# Patient Record
Sex: Male | Born: 1950 | Race: White | Hispanic: No | Marital: Single | Smoking: Never smoker
Health system: Southern US, Community
[De-identification: ages and names within clinical notes are randomized; demographics above are authoritative.]

## PROBLEM LIST (undated history)

## (undated) DIAGNOSIS — K5090 Crohn's disease, unspecified, without complications: Secondary | ICD-10-CM

## (undated) DIAGNOSIS — G4730 Sleep apnea, unspecified: Secondary | ICD-10-CM

## (undated) DIAGNOSIS — N529 Male erectile dysfunction, unspecified: Secondary | ICD-10-CM

## (undated) DIAGNOSIS — K625 Hemorrhage of anus and rectum: Secondary | ICD-10-CM

## (undated) DIAGNOSIS — E669 Obesity, unspecified: Secondary | ICD-10-CM

## (undated) DIAGNOSIS — L719 Rosacea, unspecified: Secondary | ICD-10-CM

## (undated) DIAGNOSIS — L649 Androgenic alopecia, unspecified: Secondary | ICD-10-CM

## (undated) DIAGNOSIS — L309 Dermatitis, unspecified: Secondary | ICD-10-CM

## (undated) DIAGNOSIS — G4700 Insomnia, unspecified: Secondary | ICD-10-CM

## (undated) DIAGNOSIS — K219 Gastro-esophageal reflux disease without esophagitis: Secondary | ICD-10-CM

## (undated) HISTORY — PX: HERNIA REPAIR: SHX51

## (undated) HISTORY — DX: Obesity, unspecified: E66.9

## (undated) HISTORY — PX: COSMETIC SURGERY: SHX468

## (undated) HISTORY — DX: Sleep apnea, unspecified: G47.30

## (undated) HISTORY — DX: Rosacea, unspecified: L71.9

## (undated) HISTORY — DX: Dermatitis, unspecified: L30.9

## (undated) HISTORY — DX: Gastro-esophageal reflux disease without esophagitis: K21.9

## (undated) HISTORY — DX: Male erectile dysfunction, unspecified: N52.9

## (undated) HISTORY — DX: Androgenic alopecia, unspecified: L64.9

## (undated) HISTORY — DX: Hemorrhage of anus and rectum: K62.5

## (undated) HISTORY — DX: Insomnia, unspecified: G47.00

---

## 2000-08-29 ENCOUNTER — Ambulatory Visit (HOSPITAL_COMMUNITY): Admission: RE | Admit: 2000-08-29 | Discharge: 2000-08-29 | Payer: Self-pay | Admitting: Gastroenterology

## 2000-08-29 ENCOUNTER — Encounter (INDEPENDENT_AMBULATORY_CARE_PROVIDER_SITE_OTHER): Payer: Self-pay | Admitting: *Deleted

## 2001-12-15 ENCOUNTER — Encounter: Admission: RE | Admit: 2001-12-15 | Discharge: 2001-12-15 | Payer: Self-pay | Admitting: Emergency Medicine

## 2001-12-15 ENCOUNTER — Encounter: Payer: Self-pay | Admitting: Emergency Medicine

## 2009-12-11 DIAGNOSIS — K625 Hemorrhage of anus and rectum: Secondary | ICD-10-CM

## 2009-12-11 HISTORY — DX: Hemorrhage of anus and rectum: K62.5

## 2010-05-31 ENCOUNTER — Ambulatory Visit
Admission: RE | Admit: 2010-05-31 | Discharge: 2010-05-31 | Payer: Self-pay | Source: Home / Self Care | Attending: Orthopedic Surgery | Admitting: Orthopedic Surgery

## 2010-06-04 LAB — DIFFERENTIAL
Basophils Absolute: 0 10*3/uL (ref 0.0–0.1)
Basophils Relative: 1 % (ref 0–1)
Eosinophils Absolute: 0.2 10*3/uL (ref 0.0–0.7)
Eosinophils Relative: 3 % (ref 0–5)
Lymphocytes Relative: 22 % (ref 12–46)
Lymphs Abs: 1.9 10*3/uL (ref 0.7–4.0)
Monocytes Absolute: 0.8 10*3/uL (ref 0.1–1.0)
Monocytes Relative: 9 % (ref 3–12)
Neutro Abs: 5.8 10*3/uL (ref 1.7–7.7)

## 2010-06-04 LAB — CBC
HCT: 41.4 % (ref 39.0–52.0)
Hemoglobin: 14.5 g/dL (ref 13.0–17.0)
MCH: 29.2 pg (ref 26.0–34.0)
MCHC: 35 g/dL (ref 30.0–36.0)
MCV: 83.3 fL (ref 78.0–100.0)
RBC: 4.97 MIL/uL (ref 4.22–5.81)
RDW: 12.6 % (ref 11.5–15.5)
WBC: 8.8 10*3/uL (ref 4.0–10.5)

## 2010-06-04 LAB — BASIC METABOLIC PANEL
BUN: 15 mg/dL (ref 6–23)
CO2: 28 mEq/L (ref 19–32)
Calcium: 9 mg/dL (ref 8.4–10.5)
Chloride: 106 mEq/L (ref 96–112)
Creatinine, Ser: 1.23 mg/dL (ref 0.4–1.5)
GFR calc non Af Amer: >60 mL/min (ref >60)
Glucose, Bld: 79 mg/dL (ref 70–99)
Potassium: 4.5 mEq/L (ref 3.5–5.1)
Sodium: 139 mEq/L (ref 135–145)

## 2010-06-05 LAB — WOUND CULTURE

## 2010-06-06 LAB — ANAEROBIC CULTURE: Gram Stain: NONE SEEN

## 2010-06-11 NOTE — H&P (Signed)
NAMESARATH, PRIVOTT                  ACCOUNT NO.:  [id number]  MEDICAL RECORD NO.:  [id number]          PATIENT TYPE:  AMB  LOCATION:  DSC                          FACILITY:  [facility]  PHYSICIAN:  Betha Loa, MD        DATE OF BIRTH:  [birth year]  DATE OF ADMISSION:  05/31/2010 DATE OF DISCHARGE:                             HISTORY & PHYSICAL   CHIEF COMPLAINT:  Left long finger infection.  HISTORY:  Mr. Scholz is a 60 year old right-hand-dominant white male.  He states that over the past 4 months, he has noticed a divot in the nail of his left long finger.  This would occasionally break open and drain clear fluid.  On Monday or Tuesday, he noted some drainage from underneath the cuticle.  It then began to become swollen and red.  He saw his primary care physician on Tuesday who started him on Keflex.  He followed up at an urgent care facility last night and was given an intramuscular injection of an antibiotic and a prescription for doxycycline p.o., but has not started that.  He states he feels a little better since last night.  He has had no fevers, chills, night sweats. He does not have a history of gout.  He says he does not have much pain now.  He notes no pain in the volar aspect of his finger.  He has not had symptoms like this before.  ALLERGIES:  No known drug allergies.  PAST MEDICAL HISTORY: 1. Crohn disease. 2. GERD.  PAST SURGICAL HISTORY:  Fistula removal and bowel resection secondary to Crohn.  MEDICATIONS:  Finasteride, omeprazole, and Imodium.  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 He does not smoke and drinks alcohol occasionally.  FAMILY HISTORY:  Negative.  REVIEW OF SYSTEMS:  Thirteen-point review of systems is negative.  PHYSICAL EXAMINATION:  GENERAL:  Alert and oriented x3, well developed, well nourished. HEENT:  Head; normocephalic, atraumatic. NECK:  Supple, full range of motion. CHEST:  Regular rate and  rhythm. LUNGS:  Clear to auscultation bilaterally. ABDOMEN:  Nontender, nondistended. EXTREMITIES:  Bilateral upper extremities are distally neurovascularly intact in radial, median, and ulnar nerve distributions.  He has intact sensation and capillary refill in all fingertips.  He can flex and extend the IP joints of the thumbs and can cross his fingers.  Right upper extremity is without wounds, without tenderness to palpation. Left upper extremity with the exception of the long finger is without wounds, without tenderness to palpation.  In the long finger, the nail has a divot centrally.  There is erythema at the dorsum of the DIP joint.  There is a little bit of erythema on the ulnar side of the middle phalanx.  He is not tender in the proximal or middle phalanges or the PIP joint.  He is not tender in the pad of the finger.  He is tender directly dorsally over the DIP joint and a little bit volarly as well. There is no active drainage from underneath the cuticle at this time.  ASSESSMENT AND PLAN:  I  discussed with Mr. Denis this looks like an infected mucoid cyst.  We discussed nature of mucoid cysts and distal interphalangeal joint infections.  It is possible that this is a gouty attack, although he has no history of gout.  He seems to be feeling better after antibiotics.  I think it is best to treat this as an infection of the distal interphalangeal joint.  We will draw CBC, BMP, and uric acid.  We will have him arranged for surgical drainage of the DIP joint of the left long finger.  At that time, I will try to excise the cyst as well.  If there are any significant osteophytes, I will try to excise those as well.  Risks, benefits, and alternatives of surgery were discussed including the risks of blood loss, infection; damage to nerves, vessels, tendons, ligaments, bone; failure of procedure, need for additional procedure; complications with wound healing, continued pain,  continued infection.  He voices understanding of these risks and elected to proceed.     Betha Loa, MD     KK/MEDQ  D:  05/31/2010  T:  06/01/2010  Job:  [id number]  Electronically Signed by Betha Loa  on 06/11/2010 01:53:30 PM

## 2010-06-11 NOTE — Op Note (Signed)
Joshua Miles, Joshua Miles                  ACCOUNT NO.:  [id number]  MEDICAL RECORD NO.:  [id number]          PATIENT TYPE:  AMB  LOCATION:  DSC                          FACILITY:  [facility]  PHYSICIAN:  Betha Loa, MD        DATE OF BIRTH:  April 28, 1951  DATE OF PROCEDURE:  05/31/2010 DATE OF DISCHARGE:                              OPERATIVE REPORT   PREOPERATIVE DIAGNOSIS:  Left long finger distal interphalangeal joint infection and mucoid cyst.  POSTOPERATIVE DIAGNOSIS:  Left long finger distal interphalangeal joint infection and mucoid cyst.  PROCEDURE:  Irrigation and debridement of left long finger distal interphalangeal joint and excision of mucoid cyst.  SURGEON:  Betha Loa, MD  ASSISTANT:  None.  ANESTHESIA:  Digital block with half-and-half solution 1% plain lidocaine and 0.25% plain Marcaine.  FINDINGS:  No gross purulence.  SPECIMENS:  Cultures of DIP joint to Micro.  TOURNIQUET TIME:  Penrose drain, 44 minutes.  DISPOSITION:  Stable.  INDICATIONS:  Mr. Smick is a 60 year old right-hand-dominant white male who has noted a divot in his nail for the past 3-4 months.  He said it was occasionally breaking and clear fluid would drain from underneath the nail.  Two days ago, it began to drain from underneath the cuticle and then began to get red and swollen and painful.  He saw his primary care physician who started him on Keflex.  He saw in urgent care facility last night who gave him an intramuscular shot of antibiotic.  He states he feels a little bit better today, though he still notes the swelling and erythema.  He called my office for further care.  [facility]  He was noted to have an erythematous and swollen DIP joint.  It was tender over the dorsum of the DIP joint.  He had some tenderness volarly as well.  There was no tenderness in the proximal or middle phalanges.  There was a groove in the nail, but no active drainage.  I  discussed with Mr. Wymore the nature of infected DIP joints.  We discussed the risks, benefits, and alternatives of surgical irrigation, debridement, and excision of the cyst.  Risks of blood loss, infection, damage to nerves, vessels, tendons, ligaments, and bone, failure of surgery, need for additional surgery, complications with wound healing, continued pain, and continued infection.  He voiced understanding these risks and elected to proceed.  OPERATIVE COURSE:  Mr. Wamble was identified preoperatively by myself.  He and I agreed upon procedure and site of procedure.  The surgical site was marked.  The risks, benefits, and alternatives of surgery were reviewed and he wished to proceed.  Surgical consent had been signed. He was taken to the operating room and placed on the operating table in supine position with left upper extremity on an arm board.  C-arm radiographs were taken in AP and lateral planes of the DIP joint.  There was no significant osteophyte formation.  The surgical pause was performed between surgeons, Anesthesia, and operating room staff and all were in agreement as to the patient, procedure,  and site of procedure. The left upper extremity was prepped and draped in a normal sterile orthopedic fashion using Betadine scrub and paint.  The Penrose drain was used at the proximal aspect of the long finger and was up for 44 minutes total.  A digital block had been performed after the surgical pause with a half-and-half solution of 1% plain lidocaine and 0.25% plain Marcaine.  This was adequate to give total digital anesthesia.  An incision was made on both the radial and ulnar side of the DIP joint dorsal to midline.  This was carried into the subcutaneous tissues.  There was significant inflammation.  No gross purulence was encountered.  The extensor tendon was identified and protected throughout the case.  The DIP joint was entered.  The dorsal capsule was excised.  There was  no gross purulence within here either.  Cultures of the DIP joint were sent for aerobes and anaerobes.  The DIP joint was debrided.  There was an osteophyte on the dorsoradial side of the distal phalanx and it was debrided.  This was done after irrigation of the wounds.  The wounds were copiously irrigated with 1000 mL of sterile saline.  An angiocatheter was used to irrigate the DIP joint thoroughly.  The area of the cyst at the dorsum of the distal phalanx was entered subcutaneously.  A rongeur was used to debride the cyst.  After thorough irrigation of all areas, the wound was packed open with 0.25-inch iodoform gauze.  This was placed both underneath the tendon over the DIP joint and in the area of the cyst.  The wounds were dressed with sterile Xeroform and 4 x 4's and wrapped with a Kling bandage.  An Alumafoam splint was placed and wrapped with Coban lightly.  The tourniquet was removed at 44 minutes.  The fingertip was pink with brisk capillary refill after removal of the tourniquet.  The operative drapes were broken down and the patient was raised from the bed safely.  He will be given Percocet 5/325 one to two p.o. q.6 h. p.r.n. pain, dispensed #50 and Bactrim DS 1 p.o. b.i.d. x7 days.  I will see him back in the office on Monday.  On Sunday, he will take his dressing off and pull his packing and do a warm salt water soak.  He will then replace his Alumafoam splint and rewrap his finger lightly.  I will see him on Monday.  If he has any problems, he is instructed to call the office. Procedure was tolerated well.     Betha Loa, MD     KK/MEDQ  D:  05/31/2010  T:  06/01/2010  Job:  [id number]  Electronically Signed by Betha Loa  on 06/11/2010 01:55:53 PM

## 2010-09-28 NOTE — Procedures (Signed)
[place]  Patient:    Joshua Miles, Joshua Miles                           MRN: [id number] Proc. Date: 08/29/00 Attending:  Florencia Reasons, M.D. CC:         Oley Balm. Georgina Pillion, M.D.   Procedure Report  PROCEDURE:  Colonoscopy with biopsies.  INDICATION:  This 60 year old gentleman had a terminal ileectomy around 83 for Crohns ileitis.  He is asymptomatic at this time apart from a tendency toward loose bowel movements.  He has not been maintained on therapy for his Crohns disease for many years and has had a very benign clinical course over the past two decades.  FINDINGS:  Unusual appearance, wherein the native ileocecal valve and terminal ileum appeared intact and normal, but there was also evidence of an anastomosis between the floor of the cecum and the small bowel.  Minimal exudate present at the anastomosis.  DESCRIPTION OF PROCEDURE:  The nature, purpose, and risks of the procedure had been discussed with the patient, who provided written consent.  Sedation for this procedure and the upper endoscopy which preceded it totalled fentanyl 100 mcg and Versed 10 mg IV without arrhythmias or desaturation.  The Olympus adjustable-tension pediatric video colonoscope was advanced around the colon easily.  The cecum had a surgical anastomosis from the floor of the cecum to small bowel, but the diameter of the anastomosis was not quite large enough to allow the scope to enter.  The diameter of the scope was estimated to be approximately 10 or 11 mm.  The rim of the anastomosis had minimal exudate present.  In addition, the ileocecal valve had a normal appearance, and the scope was able to be slid through the orifice of the ileocecal valve quite easily into the terminal ileum, without any evidence of stenosis.  The terminal ileum had a very normal endoscopic appearance without any inflammatory changes, exudate, erythema, ulceration, etc.  One or two  biopsies were obtained of it as well.  Pullback was then performed around the colon, which was normal.  The quality of prep was excellent, and it was felt that all areas were well-seen.  I did not see any colitis, cancer, polyps, vascular malformations, or diverticulosis, and retroflexion in the rectum was unremarkable.  The patient tolerated this procedure well, and there were no apparent complications.  IMPRESSION:  Crohns ileitis with minimal, if any, activity at the present time, with anatomic abnormality of the cecum, status post surgical anastomosis, with intact native anatomy still present, as described above.  PLAN:  Await pathology.  No endoscopic indication for medical therapy identified. DD:  08/29/00 TD:  09/01/00 Job: [id number]

## 2010-09-28 NOTE — Procedures (Signed)
[place]  Patient:    Joshua Miles, Joshua Miles                           MRN: [id number] Proc. Date: 08/29/00 Attending:  Florencia Reasons, M.D. CC:         Oley Balm. Georgina Pillion, M.D.   Procedure Report  PROCEDURE:  Upper endoscopy with biopsies.  INDICATION:  Intermittent dysphagia symptoms.  FINDINGS:  Distal erosive esophagitis with a fairly tight esophageal ring.  A 5 cm hiatal hernia.  DESCRIPTION OF PROCEDURE:  The nature, purpose, and risks of the procedure had been discussed with the patient, who provided written consent.  Sedation was fentanyl 100 mcg and Versed 10 mg IV without arrhythmias or desaturation.  The Olympus video endoscope was passed under direct vision.  The vocal cords looked normal, and the esophagus was readily entered.  The proximal esophagus was normal, but the distal several centimeters had linear erythema and a tongue-like erosion above the well-defined and fairly tight esophageal ring at 40 cm, below which was a 5 cm hiatal hernia.  The stomach contained no significant residual and had entirely normal mucosa, including retroflex view of the proximal stomach, which showed that the diaphragmatic hiatus was actually fairly tight rather than patulous in character.  The pylorus, duodenal bulb, and second duodenum looked normal. Biopsies were obtained from the eroded area of the distal esophagus prior to removal of the scope.  The patient tolerated the procedure well, and there were no apparent complications.  IMPRESSION: 1. Distal erosive esophagitis. 2. Esophageal ring. 3. A 5 cm hiatal hernia.  PLAN: 1. Initiate PPI therapy with Protonix 40 mg daily. 2. Await pathology on todays biopsies. 3. Proceed to colonoscopic evaluation in view of history of Crohns disease. DD:  08/29/00 TD:  09/01/00 Job: [id number]

## 2012-11-22 ENCOUNTER — Encounter (HOSPITAL_COMMUNITY): Payer: Self-pay | Admitting: Family Medicine

## 2012-11-22 ENCOUNTER — Emergency Department (HOSPITAL_COMMUNITY)
Admission: EM | Admit: 2012-11-22 | Discharge: 2012-11-22 | Disposition: A | Discharge status: Home / Self Care | Payer: BC Managed Care – PPO | Attending: Emergency Medicine | Admitting: Emergency Medicine

## 2012-11-22 DIAGNOSIS — Z0489 Encounter for examination and observation for other specified reasons: Secondary | ICD-10-CM | POA: Insufficient documentation

## 2012-11-22 DIAGNOSIS — Z79899 Other long term (current) drug therapy: Secondary | ICD-10-CM | POA: Insufficient documentation

## 2012-11-22 DIAGNOSIS — K5090 Crohn's disease, unspecified, without complications: Secondary | ICD-10-CM | POA: Insufficient documentation

## 2012-11-22 HISTORY — DX: Crohn's disease, unspecified, without complications: K50.90

## 2012-11-22 NOTE — ED Notes (Signed)
Patient states that he was pulled over by the police and charged with DUI. States he blew breathalyzer and requested to have blood test drawn but request was declined by [employer]. Patient presents requesting to have a blood alcohol drawn.

## 2012-11-22 NOTE — ED Notes (Signed)
Patient states that he is here to have a blood alcohol test drawn. Verbal consent given by patient to draw his blood at his request. Procedure explained to patient and patient verbalized understanding. Tourniquet was applied to left arm. Left antecubital area cleaned with betadine. Betadine wiped from area with sterile saline and area was dried to with sterile 2 x 2 gauze. One vial of blood obtained with a 23G x 3/4" Push Button Blood Collection Set without difficulty. Tourniquet was removed and a gauze dressing was applied to venipuncture site. Vial of blood was labeled in front of patient after 2 patient identifiers (Name and date of birth) were verbalized by patient. Vial of blood sent to lab for analysis.

## 2012-11-26 NOTE — ED Provider Notes (Signed)
   History    CSN: [id number] Arrival date & time 11/22/12  0155  First MD Initiated Contact with Patient 11/22/12 0256     Chief Complaint  Patient presents with  . Drug / Alcohol Assessment  . Labs Only   (Consider location/radiation/quality/duration/timing/severity/associated sxs/prior Treatment) HPI Pt is here for blood alcohol testing. Pulled over by [employer] earlier in the evening on suspicion for driving under the influence of alcohol. Pt states he just had a few beers. No current complaints.  Past Medical History  Diagnosis Date  . Crohn's disease    Past Surgical History  Procedure Laterality Date  . Hernia repair    . Cosmetic surgery     No family history on file. History  Substance Use Topics  . Smoking status: Never Smoker   . Smokeless tobacco: Not on file  . Alcohol Use: Yes     Comment: 2 beers/week    Review of Systems  Gastrointestinal: Negative for nausea and vomiting.  Neurological: Negative for dizziness, weakness, numbness and headaches.  Psychiatric/Behavioral: Negative for confusion.  All other systems reviewed and are negative.    Allergies  Review of patient's allergies indicates no known allergies.  Home Medications   Current Outpatient Rx  Name  Route  Sig  Dispense  Refill  . acetaminophen (TYLENOL) 500 MG tablet   Oral   Take 500 mg by mouth every 6 (six) hours as needed for pain.         . eszopiclone (LUNESTA) 2 MG TABS   Oral   Take 2 mg by mouth daily as needed (sleep). Take immediately before bedtime         . febuxostat (ULORIC) 40 MG tablet   Oral   Take 40 mg by mouth every morning.         . loperamide (IMODIUM) 2 MG capsule   Oral   Take 2 mg by mouth 4 (four) times daily as needed for diarrhea or loose stools.          BP 129/78  Pulse 90  Temp(Src) 98 F (36.7 C) (Oral)  Resp 18  SpO2 97% Physical Exam  Nursing note and vitals reviewed. Constitutional: He is oriented to person, place, and time. He  appears well-developed and well-nourished. No distress.  HENT:  Head: Normocephalic and atraumatic.  Mouth/Throat: Oropharynx is clear and moist.  Eyes: EOM are normal. Pupils are equal, round, and reactive to light.  Neck: Normal range of motion. Neck supple.  Cardiovascular: Normal rate and regular rhythm.   Pulmonary/Chest: Effort normal.  Abdominal: Soft. Bowel sounds are normal.  Musculoskeletal: Normal range of motion. He exhibits no edema and no tenderness.  Neurological: He is alert and oriented to person, place, and time.  Moves all ext without deficit, sensation intact, clear speech, ambulatory without assistance.   Skin: Skin is warm and dry. No rash noted. No erythema.  Psychiatric: He has a normal mood and affect. His behavior is normal.    ED Course  Procedures (including critical care time) Labs Reviewed  ETHANOL - Abnormal; Notable for the following:    Alcohol, Ethyl (B) 145 (*)    All other components within normal limits   No results found. 1. Encounter for blood-alcohol test     MDM    Loren Racer, MD 11/26/12 [phone]

## 2013-08-25 ENCOUNTER — Other Ambulatory Visit: Payer: Self-pay | Admitting: Family Medicine

## 2013-08-25 ENCOUNTER — Ambulatory Visit
Admission: RE | Admit: 2013-08-25 | Discharge: 2013-08-25 | Disposition: A | Discharge status: Home / Self Care | Payer: BC Managed Care – PPO | Source: Ambulatory Visit | Attending: Family Medicine | Admitting: Family Medicine

## 2013-08-25 DIAGNOSIS — R079 Chest pain, unspecified: Secondary | ICD-10-CM

## 2013-09-10 ENCOUNTER — Encounter: Payer: Self-pay | Admitting: General Surgery

## 2013-09-10 DIAGNOSIS — R079 Chest pain, unspecified: Secondary | ICD-10-CM

## 2013-09-22 ENCOUNTER — Ambulatory Visit: Payer: BC Managed Care – PPO | Admitting: Cardiology

## 2013-09-28 ENCOUNTER — Encounter: Payer: Self-pay | Admitting: Cardiology

## 2013-09-28 ENCOUNTER — Ambulatory Visit (INDEPENDENT_AMBULATORY_CARE_PROVIDER_SITE_OTHER): Payer: BC Managed Care – PPO | Admitting: Cardiology

## 2013-09-28 VITALS — BP 127/78 | HR 74 | Ht 72.0 in | Wt 235.0 lb

## 2013-09-28 DIAGNOSIS — R079 Chest pain, unspecified: Secondary | ICD-10-CM

## 2013-09-28 DIAGNOSIS — G4730 Sleep apnea, unspecified: Secondary | ICD-10-CM | POA: Insufficient documentation

## 2013-09-28 NOTE — Progress Notes (Signed)
[address] [place], [place]  [address] Phone: [phone] Fax:  [phone]  Date:  09/28/2013   ID:  Joshua CrockerRandy R Seder, DOB 1951/03/29, MRN [id number]  PCP:  Darrow BussingKOIRALA,DIBAS, MD  Cardiologist:  Armanda Magicraci Dashonda Bonneau, MD     History of Present Illness: Joshua Miles is a 63 y.o. male with a history of GERD who presents today for evaluation of chest pain.  He recently saw his PCP complaining that for several days he has been having left sided chest pain that is sharp and does not radiate and only lasts a few minutes.  He also has some chest discomfort that he describes as a chest pressure that only occurs at rest and usually occurs mainly at night.  This all seemed to start about 2 months ago at the time when he had a viral infection with acute labarynthitis.   He EKG done in PCP office on 07/09/2013 showed NSR with with no ST change.  He denies any SOB, DOE, palpitations, LE edema or syncope.  He is very active and walks for exercise and works out in his yard and never gets any chest discomfort.  He also has OSA but has not tolerated CPAP in the past but would like to try again.   Wt Readings from Last 3 Encounters:  09/28/13 235 lb (106.595 kg)     Past Medical History  Diagnosis Date  . Crohn's disease   . ED (erectile dysfunction)     secondary to fracture penis-Repair by Dr Logan BoresEvans  . Rosacea   . Esophageal reflux     well controlled on prilosec  . Insomnia   . Sleep apnea     Dr Pollyann Kennedyosen CPAP  . Eczema   . Male pattern alopecia   . Rectal bleeding 08','11    Hemorrhoids??    Current Outpatient Prescriptions  Medication Sig Dispense Refill  . acetaminophen (TYLENOL) 500 MG tablet Take 500 mg by mouth every 6 (six) hours as needed for pain.      . clotrimazole-betamethasone (LOTRISONE) cream Apply 1 application topically as needed.      . eszopiclone (LUNESTA) 2 MG TABS Take 2 mg by mouth daily as needed (sleep). Take immediately before bedtime      . febuxostat (ULORIC) 40 MG  tablet Take 80 mg by mouth every morning.       . finasteride (PROSCAR) 5 MG tablet Take 5 mg by mouth daily. Pt takes 1/4 tablet daily      . loperamide (IMODIUM) 2 MG capsule Take 2 mg by mouth once.       Marland Kitchen. omeprazole (PRILOSEC) 20 MG capsule Take 20 mg by mouth daily.      . sildenafil (VIAGRA) 100 MG tablet Take 100 mg by mouth daily as needed for erectile dysfunction.      . tacrolimus (PROTOPIC) 0.1 % ointment Apply topically 2 (two) times daily.       No current facility-administered medications for this visit.    Allergies:   No Known Allergies  Social History:  The patient  reports that he has never smoked. He does not have any smokeless tobacco history on file. He reports that he drinks alcohol. He reports that he does not use illicit drugs.   Family History:  The patient's family history includes CAD in his father; Heart disease in his father.   ROS:  Please see the history of present illness.      All other systems reviewed and  negative.   PHYSICAL EXAM: VS:  BP 127/78  Pulse 74  Ht 6' (1.829 m)  Wt 235 lb (106.595 kg)  BMI 31.86 kg/m2 Well nourished, well developed, in no acute distress HEENT: normal Neck: no JVD Cardiac:  normal S1, S2; RRR; no murmur Lungs:  clear to auscultation bilaterally, no wheezing, rhonchi or rales Abd: soft, nontender, no hepatomegaly Ext: no edema Skin: warm and dry Neuro:  CNs 2-12 intact, no focal abnormalities noted       ASSESSMENT AND PLAN:  1. Chest pain with atypical components.  EKG is nonischemic.  His CRF include age 76>40, male sex and family history of CAD. - Stress myoview 2.  OSA currently not using CPAP.  He has been intolerant of any of the masks in the past.  He is a mouth breather and cannot use anything other than a full face mask which he has not tolerated. He feels like he is not getting enough air with the CPAP - I will get a split night PSG   Followup with me PRN  Signed, Armanda Magicraci Carma Dwiggins, MD 09/28/2013 11:36 AM

## 2013-09-28 NOTE — Patient Instructions (Signed)
Your physician recommends that you continue on your current medications as directed. Please refer to the Current Medication list given to you today.  Your physician has requested that you have an exercise stress myoview. For further information please visit [email or web address]. Please follow instruction sheet, as given.  Your physician has recommended that you have a sleep study. This test records several body functions during sleep, including: brain activity, eye movement, oxygen and carbon dioxide blood levels, heart rate and rhythm, breathing rate and rhythm, the flow of air through your mouth and nose, snoring, body muscle movements, and chest and belly movement. (Schedule at [facility])  Your physician recommends that you schedule a follow-up appointment As Needed

## 2013-10-12 ENCOUNTER — Ambulatory Visit (HOSPITAL_COMMUNITY): Payer: BC Managed Care – PPO | Attending: Cardiology | Admitting: Radiology

## 2013-10-12 VITALS — BP 112/84 | Ht 72.0 in | Wt 229.0 lb

## 2013-10-12 DIAGNOSIS — R079 Chest pain, unspecified: Secondary | ICD-10-CM

## 2013-10-12 DIAGNOSIS — R42 Dizziness and giddiness: Secondary | ICD-10-CM | POA: Insufficient documentation

## 2013-10-12 DIAGNOSIS — R0989 Other specified symptoms and signs involving the circulatory and respiratory systems: Secondary | ICD-10-CM

## 2013-10-12 DIAGNOSIS — R0609 Other forms of dyspnea: Secondary | ICD-10-CM

## 2013-10-12 MED ORDER — TECHNETIUM TC 99M SESTAMIBI GENERIC - CARDIOLITE
11.0000 | Freq: Once | INTRAVENOUS | Status: AC | PRN
  Administered 2013-10-12: 11 via INTRAVENOUS

## 2013-10-12 MED ORDER — TECHNETIUM TC 99M SESTAMIBI GENERIC - CARDIOLITE
33.0000 | Freq: Once | INTRAVENOUS | Status: AC | PRN
Start: 2013-10-12 — End: 2013-10-12
  Administered 2013-10-12: 33 via INTRAVENOUS

## 2013-10-12 NOTE — Progress Notes (Signed)
[facility] SITE 3 NUCLEAR MED [address] [place], [place] [address] [phone]    Cardiology Nuclear Med Study  Joshua Miles is a 63 y.o. male     MRN : [id number]     DOB: 11-May-1951  Procedure Date: 10/12/2013  Nuclear Med Background Indication for Stress Test:  Evaluation for Ischemia History: No Known History of CAD Cardiac Risk Factors: Family History - CAD  Symptoms:  Chest Pain;Dizziness   Nuclear Pre-Procedure Caffeine/Decaff Intake:  None> 12 hrs NPO After: 7:30am   Lungs:  clear O2 Sat: 97% on room air. IV 0.9% NS with Angio Cath:  22g  IV Site: R Antecubital x 1, tolerated well IV Started by:  Irean Hong, RN  Chest Size (in):  46 Cup Size: n/a  Height: 6' (1.829 m)  Weight:  229 lb (103.874 kg)  BMI:  Body mass index is 31.05 kg/(m^2). Tech Comments:  N/A    Nuclear Med Study 1 or 2 day study: 1 day  Stress Test Type:  Stress  Reading MD: N/A  Order Authorizing Provider:  Armanda Magic, MD  Resting Radionuclide: Technetium 61m Sestamibi  Resting Radionuclide Dose: 11.0 mCi   Stress Radionuclide:  Technetium 38m Sestamibi  Stress Radionuclide Dose: 33.0 mCi           Stress Protocol Rest HR: 50 Stress HR: 151  Rest BP: 112/84 Stress BP: 150/70  Exercise Time (min): 8:04 METS: 10.10   Predicted Max HR: 158 bpm % Max HR: 95.57 bpm Rate Pressure Product: 14970   Dose of Adenosine (mg):  n/a Dose of Lexiscan: n/a mg  Dose of Atropine (mg): n/a Dose of Dobutamine: n/a mcg/kg/min (at max HR)  Stress Test Technologist: Frederick Peers, EMT-P  Nuclear Technologist:  Doyne Keel, CNMT     Rest Procedure:  Myocardial perfusion imaging was performed at rest 45 minutes following the intravenous administration of Technetium 39m Sestamibi. Rest ECG: NSR - Normal EKG  Stress Procedure:  The patient exercised on the treadmill utilizing the Bruce Protocol for 8:04 minutes. The patient stopped due to sob and denied any chest pain.  Technetium 68m  Sestamibi was injected at peak exercise and myocardial perfusion imaging was performed after a brief delay. Stress ECG: No significant change from baseline ECG  QPS Raw Data Images:  Normal; no motion artifact; normal heart/lung ratio. Stress Images:  Normal homogeneous uptake in all areas of the myocardium. Rest Images:  Normal homogeneous uptake in all areas of the myocardium. Subtraction (SDS):  Normal Transient Ischemic Dilatation (Normal <1.22):  1.04 Lung/Heart Ratio (Normal <0.45):  0.35  Quantitative Gated Spect Images QGS EDV:  153 ml QGS ESV:  79 ml  Impression Exercise Capacity:  Fair exercise capacity. BP Response:  Flat BP response Clinical Symptoms:  There is dyspnea. ECG Impression:  No significant ST segment change suggestive of ischemia. Comparison with Prior Nuclear Study: No images to compare  Overall Impression:  Normal stress nuclear study.  See EF below  LV Ejection Fraction: 49%.  LV Wall Motion:  Gated images suggest mild diffuse hypokinesis Suggest MRI or echo correlation   Wendall Stade

## 2013-10-15 ENCOUNTER — Other Ambulatory Visit: Payer: Self-pay | Admitting: *Deleted

## 2013-10-15 DIAGNOSIS — R9439 Abnormal result of other cardiovascular function study: Secondary | ICD-10-CM

## 2013-11-05 ENCOUNTER — Ambulatory Visit (HOSPITAL_COMMUNITY): Payer: BC Managed Care – PPO | Attending: Internal Medicine | Admitting: Cardiology

## 2013-11-05 DIAGNOSIS — I359 Nonrheumatic aortic valve disorder, unspecified: Secondary | ICD-10-CM | POA: Insufficient documentation

## 2013-11-05 DIAGNOSIS — I079 Rheumatic tricuspid valve disease, unspecified: Secondary | ICD-10-CM | POA: Insufficient documentation

## 2013-11-05 DIAGNOSIS — R079 Chest pain, unspecified: Secondary | ICD-10-CM

## 2013-11-05 DIAGNOSIS — R072 Precordial pain: Secondary | ICD-10-CM | POA: Insufficient documentation

## 2013-11-05 DIAGNOSIS — R9439 Abnormal result of other cardiovascular function study: Secondary | ICD-10-CM | POA: Insufficient documentation

## 2013-11-05 NOTE — Progress Notes (Signed)
Echo performed. 

## 2014-01-19 ENCOUNTER — Ambulatory Visit (INDEPENDENT_AMBULATORY_CARE_PROVIDER_SITE_OTHER): Payer: BC Managed Care – PPO | Admitting: Physician Assistant

## 2014-01-19 ENCOUNTER — Encounter: Payer: Self-pay | Admitting: Physician Assistant

## 2014-01-19 VITALS — BP 118/81 | HR 67 | Ht 72.0 in | Wt 234.0 lb

## 2014-01-19 DIAGNOSIS — R079 Chest pain, unspecified: Secondary | ICD-10-CM

## 2014-01-19 NOTE — Patient Instructions (Addendum)
Your physician has recommended you make the following change in your medication:   1. Increase Prilosec 20 mg to 2 capsules daily for one week and then go back to 1 capsule daily.   Your physician recommends that you schedule a follow-up appointment in: 2-3 months with Dr. Mayford Knife.

## 2014-01-19 NOTE — Progress Notes (Signed)
Cardiology Office Note    Date:  01/19/2014   ID:  LILIANA BRENTLINGER, DOB Aug 08, 1950, MRN [id number]  PCP:  Darrow Bussing, MD  Cardiologist:  Dr. Armanda Magic      History of Present Illness: Joshua Miles is a 63 y.o. male with a hx of GERD, OSA.  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  He was evaluated by Dr. Armanda Magic in 09/2013 for chest pain.  He was set up for stress testing which demonstrated no ischemia.  EF was 49% on nuclear scan and FU echo demonstrated EF 45-50% with mod LVH, mild AI.  No further workup was recommended.  He showed up in our office today asking to be seen for FU on his chest pain.  Symptoms are quite atypical.  He has chest pressure on the L only with positional changes.  He also notes some sharp pains that only last seconds.  Denies dyspnea.  Denies orthopnea, PND, edema.  Denies syncope.  He has noted some occasional lightheadedness.  Most recent episode occurred outside in the extreme heat after drinking some beers after standing up quickly.  He remains quite active.  He can push mow his yard, walk 5+ miles or ride his bike without chest pain.     Studies:  - Echo (6/15):  Mod LVH, EF 45-50%, no RWMA, mild AI, mild RAE  - Nuclear (6/15):  No ischemia, EF 49%, NORMAL   Recent Labs/Images: No results found for requested labs within last 365 days.    Wt Readings from Last 3 Encounters:  01/19/14 234 lb (106.142 kg)  10/12/13 229 lb (103.874 kg)  09/28/13 235 lb (106.595 kg)     Past Medical History  Diagnosis Date  . Crohn's disease   . ED (erectile dysfunction)     secondary to fracture penis-Repair by Dr Logan Bores  . Rosacea   . Esophageal reflux     well controlled on prilosec  . Insomnia   . Sleep apnea     Dr Pollyann Kennedy CPAP  . Eczema   . Male pattern alopecia   . Rectal bleeding 08','11    Hemorrhoids??    Current Outpatient Prescriptions  Medication Sig Dispense Refill  . acetaminophen (TYLENOL) 500 MG tablet Take  500 mg by mouth every 6 (six) hours as needed for pain.      . clotrimazole-betamethasone (LOTRISONE) cream Apply 1 application topically as needed.      . eszopiclone (LUNESTA) 2 MG TABS Take 2 mg by mouth daily as needed (sleep). Take immediately before bedtime      . febuxostat (ULORIC) 40 MG tablet Take 80 mg by mouth every morning.       . finasteride (PROSCAR) 5 MG tablet Take 5 mg by mouth daily. Pt takes 1/4 tablet daily      . loperamide (IMODIUM) 2 MG capsule Take 2 mg by mouth once.       Marland Kitchen omeprazole (PRILOSEC) 20 MG capsule Take 20 mg by mouth daily.      . sildenafil (VIAGRA) 100 MG tablet Take 100 mg by mouth daily as needed for erectile dysfunction.      . tacrolimus (PROTOPIC) 0.1 % ointment Apply topically 2 (two) times daily.       No current facility-administered medications for this visit.     Allergies:   Review of patient's allergies indicates no known allergies.   Social History:  The patient  reports that he has never  smoked. He does not have any smokeless tobacco history on file. He reports that he drinks alcohol. He reports that he does not use illicit drugs.   Family History:  The patient's family history includes CAD in his father; Heart disease in his father; Heart failure in his father.   ROS:  Please see the history of present illness.      All other systems reviewed and negative.   PHYSICAL EXAM: VS:  BP 118/81  Pulse 67  Ht 6' (1.829 m)  Wt 234 lb (106.142 kg)  BMI 31.73 kg/m2 Well nourished, well developed, in no acute distress HEENT: normal Neck: no JVD Vascular:  No carotid bruits Cardiac:  normal S1, S2; RRR; no murmur Lungs:  clear to auscultation bilaterally, no wheezing, rhonchi or rales Abd: soft, nontender, no hepatomegaly Ext: no edema Skin: warm and dry Neuro:  CNs 2-12 intact, no focal abnormalities noted  EKG:  NSR, HR 67, normal axis, no ST changes     ASSESSMENT AND PLAN:  1.  Chest pain:  Symptoms are very atypical. I  reviewed his stress test and Echo with him today.  As noted, his Myoview is low risk and his Echo demonstrates low normal LVF.  He has no signs or symptoms of CHF.  He has been able to exert himself without chest pain.  His ECG is normal.  Overall, the likelihood that his symptoms are secondary to ischemic heart disease is very low.  No further workup is indicated.  I have asked him to increase his Prilosec to 40 mg QD x 1 week to see if this helps.  We discussed symptoms to look out for that would prompt sooner FU.  I spent > 25 minutes counseling the patient today and reviewing his test results.   Disposition:  FU with Dr. Armanda Magic in 2-3 months.   Signed, Brynda Rim, MHS 01/19/2014 3:20 PM    [facility] [address] [place], [place], [place]  [address] Phone: [phone]; Fax: [phone]

## 2014-01-20 ENCOUNTER — Ambulatory Visit: Payer: BC Managed Care – PPO | Admitting: Cardiology

## 2014-01-25 ENCOUNTER — Ambulatory Visit: Payer: BC Managed Care – PPO | Admitting: Physician Assistant

## 2014-04-13 ENCOUNTER — Encounter: Payer: Self-pay | Admitting: Cardiology

## 2014-04-13 ENCOUNTER — Ambulatory Visit (INDEPENDENT_AMBULATORY_CARE_PROVIDER_SITE_OTHER): Payer: BC Managed Care – PPO | Admitting: Cardiology

## 2014-04-13 VITALS — BP 116/84 | HR 89 | Ht 72.0 in | Wt 235.0 lb

## 2014-04-13 DIAGNOSIS — R079 Chest pain, unspecified: Secondary | ICD-10-CM

## 2014-04-13 DIAGNOSIS — G4730 Sleep apnea, unspecified: Secondary | ICD-10-CM

## 2014-04-13 NOTE — Patient Instructions (Addendum)
Your physician has recommended that you have a sleep study ASAP. This test records several body functions during sleep, including: brain activity, eye movement, oxygen and carbon dioxide blood levels, heart rate and rhythm, breathing rate and rhythm, the flow of air through your mouth and nose, snoring, body muscle movements, and chest and belly movement.  Your physician recommends that you schedule a follow-up appointment AS NEEDED with Dr. Mayford Knifeurner.

## 2014-04-13 NOTE — Progress Notes (Signed)
[address] [place], [place]  [address] Phone: [phone] Fax:  [phone]  Date:  04/13/2014   ID:  Joshua CrockerRandy R Miles, DOB [birth year], MRN [id number]  PCP:  Joshua BussingKOIRALA,Joshua Miles  Cardiologist:  Joshua Magicraci Liba Hulsey, Miles    History of Present Illness: Joshua CrockerRandy R Miles is a 63 y.o. male with a history of GERD who presents today for evaluation of chest pain. He recently saw his PCP complaining that for several days he has been having left sided chest pain that is sharp and does not radiate and only lasts a few minutes. He also has some chest discomfort that he describes as a chest pressure that only occurs at rest and usually occurs mainly at night. This all seemed to start about 2 months ago at the time when he had a viral infection with acute labarynthitis. He underwent nuclear stress test that showed no ischemia with low normal EF at 49%.  He also has OSA but has not tolerated CPAP in the past but would like to try again.  He was set up for a sleep study but decided not to follow through with it due to medical bills that he had.  He still wants to have it done.  Since I saw him last he has not had as much chest discomfort that again is sharp and improved with movement.  He denies any SOB, DOE or palpitations.   Wt Readings from Last 3 Encounters:  04/13/14 235 lb (106.595 kg)  01/19/14 234 lb (106.142 kg)  10/12/13 229 lb (103.874 kg)     Past Medical History  Diagnosis Date  . Crohn's disease   . ED (erectile dysfunction)     secondary to fracture penis-Repair by Joshua Miles  . Rosacea   . Esophageal reflux     well controlled on prilosec  . Insomnia   . Sleep apnea     Joshua Joshua Kennedyosen CPAP  . Eczema   . Male pattern alopecia   . Rectal bleeding 08','11    Hemorrhoids??    Current Outpatient Prescriptions  Medication Sig Dispense Refill  . acetaminophen (TYLENOL) 500 MG tablet Take 500 mg by mouth every 6 (six) hours as needed for pain.    . clotrimazole-betamethasone (LOTRISONE)  cream Apply 1 application topically as needed.    . eszopiclone (LUNESTA) 2 MG TABS Take 2 mg by mouth daily as needed (sleep). Take immediately before bedtime    . febuxostat (ULORIC) 40 MG tablet Take 80 mg by mouth every morning.     . finasteride (PROSCAR) 5 MG tablet Take 5 mg by mouth daily. Pt takes 1/4 tablet daily    . loperamide (IMODIUM) 2 MG capsule Take 2 mg by mouth once.     Marland Kitchen. omeprazole (PRILOSEC) 20 MG capsule Take 20 mg by mouth daily.    . sildenafil (VIAGRA) 100 MG tablet Take 100 mg by mouth daily as needed for erectile dysfunction.    . tacrolimus (PROTOPIC) 0.1 % ointment Apply topically 2 (two) times daily.     No current facility-administered medications for this visit.    Allergies:   No Known Allergies  Social History:  The patient  reports that he has never smoked. He does not have any smokeless tobacco history on file. He reports that he drinks alcohol. He reports that he does not use illicit drugs.   Family History:  The patient's family history includes CAD in his father; Heart disease in his father; Heart failure in  his father.   ROS:  Please see the history of present illness.      All other systems reviewed and negative.   PHYSICAL EXAM: VS:  BP 116/84 mmHg  Pulse 89  Ht 6' (1.829 m)  Wt 235 lb (106.595 kg)  BMI 31.86 kg/m2 Well nourished, well developed, in no acute distress HEENT: normal Neck: no JVD Cardiac:  normal S1, S2; RRR; no murmur Lungs:  clear to auscultation bilaterally, no wheezing, rhonchi or rales Abd: soft, nontender, no hepatomegaly Ext: no edema Skin: warm and dry Neuro:  CNs 2-12 intact, no focal abnormalities noted  EKG:  NSR with no ST changes     ASSESSMENT AND PLAN: 1.  Chest pain with atypical components. EKG is nonischemic. His CRF include age > 6140, male sex and family history of CAD. Stress myoview showed no ischemia and low normal LVF. 2. OSA currently not using CPAP. He has been intolerant of any of the masks  in the past. He is a mouth breather and cannot use anything other than a full face mask which he has not tolerated. He feels like he is not getting enough air with the CPAP.  He has not been sleeping well and wakes up multiple times nightly and then feels exhausted in the am.  He want to proceed with sleep study.   - I will get a split night PSG   Followup with me PRN  Signed, Joshua Magicraci Randa Riss, [facility] 04/13/2014 3:00 PM

## 2014-04-17 ENCOUNTER — Ambulatory Visit (HOSPITAL_BASED_OUTPATIENT_CLINIC_OR_DEPARTMENT_OTHER): Payer: BC Managed Care – PPO | Attending: Cardiology

## 2014-04-17 DIAGNOSIS — G4710 Hypersomnia, unspecified: Secondary | ICD-10-CM | POA: Insufficient documentation

## 2014-04-17 DIAGNOSIS — R0683 Snoring: Secondary | ICD-10-CM | POA: Diagnosis present

## 2014-04-17 DIAGNOSIS — G4733 Obstructive sleep apnea (adult) (pediatric): Secondary | ICD-10-CM

## 2014-04-17 DIAGNOSIS — Z9989 Dependence on other enabling machines and devices: Secondary | ICD-10-CM

## 2014-04-17 DIAGNOSIS — G4730 Sleep apnea, unspecified: Secondary | ICD-10-CM

## 2014-04-27 ENCOUNTER — Telehealth: Payer: Self-pay | Admitting: Cardiology

## 2014-04-27 NOTE — Telephone Encounter (Signed)
Please let patient know that he has moderate OSA and had a successful CPAP titration to 13cm H2O.  Please let DME know that I placed CPAP order in EPIC and set up OV with me in 10 weeks

## 2014-04-27 NOTE — Addendum Note (Signed)
Addended by: Armanda MagicURNER, Jacere Pangborn R on: 04/27/2014 01:24 PM   Modules accepted: Orders

## 2014-04-27 NOTE — Sleep Study (Signed)
   NAME: Joshua CrockerRandy R Miles DATE OF BIRTH:  1950-12-07 MEDICAL RECORD NUMBER [id number]  LOCATION: [facility]  PHYSICIAN: Glendi Mohiuddin R  DATE OF STUDY: 04/17/2014  SLEEP STUDY TYPE: Split night study with Nocturnal polysomnography and Positive Airway Pressure Titration               REFERRING PHYSICIAN: Quintella Reicherturner, Tallia Moehring R, MD  INDICATION FOR STUDY: OSA, excessive daytime sleepiness, snoring and witnessed apnea  EPWORTH SLEEPINESS SCORE: 14 HEIGHT: 6'0" WEIGHT: 250lbs NECK SIZE: 17 in.  MEDICATIONS: Reviewed in the chart  SLEEP ARCHITECTURE: During the diagnostic portion of the study the patient slept a total of 128 minutes with no slow wave sleep and 15 minutes of REM sleep.  The onset to sleep latency was normal at 4 minutes and onset to REM sleep latency was normal at 110 minutes.  The sleep efficiency was normal at 89%.  During the CPAP titration the total sleep time was 208 minutes with no slow wave sleep and 41 minutes of REM sleep.  The onset to sleep latency was 0 and the onset to REM sleep was 54 minutes.  The sleep efficiency was reduced 79%.    RESPIRATORY DATA: The patient had a total of 21 obstructive apneas and 24 hypopneas during the diagnostic portion of the study for an AHI of 21 events per hour consistent with moderate obstructive sleep apnea/hypopnea syndrome.  All events occurred in the supine position with equal number in REM and NREM sleep.  The patient underwent CPAP titration starting at 4cm H2O and pressure was increased for continued respiratory events and snoring to 13cm H2O.  The patient was able to reach REM sleep but unable to maintain the supine position for prolonged periods of time without further respiratory events.  The AHI at 13cm H2O was 0.    OXYGEN DATA: The nadir O2 saturation during the diagnostic portion of the study was 87% in REM sleep and 84% in NREM sleep.  During the CPAP titration, the O2 saturation nadir was 91% for both REM and  NREM sleep.  There were no O2 desaturations below 88%.  CARDIAC DATA: The patient maintained NSR with PAC's during the study.  MOVEMENT/PARASOMNIA: There were no periodic limb movements or REM Behavior disorders noted.  IMPRESSION/ RECOMMENDATION:   1.  Moderate obstructive sleep apnea/hypopnea syndrome with AHI of 21 events per hour.  All events occurred in the supine position and equally between REM and NREM sleep. 2.  Reduced sleep efficiency with increased frequency of arousals.    3.  Mild to moderate snoring. 4.  Abnormal sleep architecture with reduced REM sleep. 5.  Successful CPAP titration to 3630m H2O with medium Fisher Paykel Simplus full face mask 6.  The patient should be counseled in good sleep hygiene and weigh loss. 7.  The patient should be counseled to avoid sleeping supine.  Signed: Quintella ReichertURNER,Sylvi Rybolt R Diplomate, American Board of Sleep Medicine  ELECTRONICALLY SIGNED ON:  04/27/2014, 1:08 PM [facility] PH: [phone]   FX: [phone] ACCREDITED BY THE AMERICAN ACADEMY OF SLEEP MEDICINE

## 2014-05-02 NOTE — Telephone Encounter (Signed)
Discussed with patient that he has moderate sleep apnea and Dr. Mayford Knifeurner has ordered a CPAP machine.   He verbalized understanding. F/U Appointment scheduled for 07/27/14

## 2014-06-20 ENCOUNTER — Institutional Professional Consult (permissible substitution): Payer: BC Managed Care – PPO | Admitting: Pulmonary Disease

## 2014-07-25 ENCOUNTER — Institutional Professional Consult (permissible substitution): Payer: Self-pay | Admitting: Pulmonary Disease

## 2014-07-26 ENCOUNTER — Encounter: Payer: Self-pay | Admitting: Cardiology

## 2014-07-26 DIAGNOSIS — E669 Obesity, unspecified: Secondary | ICD-10-CM

## 2014-07-26 HISTORY — DX: Obesity, unspecified: E66.9

## 2014-07-26 NOTE — Progress Notes (Signed)
This encounter was created in error - please disregard.

## 2014-07-27 ENCOUNTER — Encounter: Payer: Self-pay | Admitting: Cardiology

## 2014-07-27 ENCOUNTER — Encounter: Payer: BLUE CROSS/BLUE SHIELD | Admitting: Cardiology

## 2014-07-27 VITALS — BP 110/76 | HR 83 | Ht 72.0 in | Wt 236.8 lb

## 2014-08-11 ENCOUNTER — Telehealth: Payer: Self-pay | Admitting: Cardiology

## 2014-08-11 NOTE — Telephone Encounter (Signed)
Informed patient that [facility]. Patient grateful for callback.

## 2014-08-11 NOTE — Telephone Encounter (Signed)
New message      Pt has not heard from the home health agency regarding setting up the cpap.  Please call

## 2014-08-12 NOTE — Telephone Encounter (Signed)
Per Henderson NewcomerMelissa Stenson, [facility] has received the information and will be calling him ASAP to get things set up.

## 2014-10-05 ENCOUNTER — Institutional Professional Consult (permissible substitution): Payer: Self-pay | Admitting: Pulmonary Disease

## 2014-10-13 ENCOUNTER — Ambulatory Visit: Payer: BLUE CROSS/BLUE SHIELD | Admitting: Cardiology

## 2014-11-05 IMAGING — CR DG CHEST 2V
2 series · 2 of 2 positions shown · non-contrast
Comparison: None.

CLINICAL DATA: Left chest pain.

EXAM:
CHEST  2 VIEW

[view not recorded (1 of 2)]
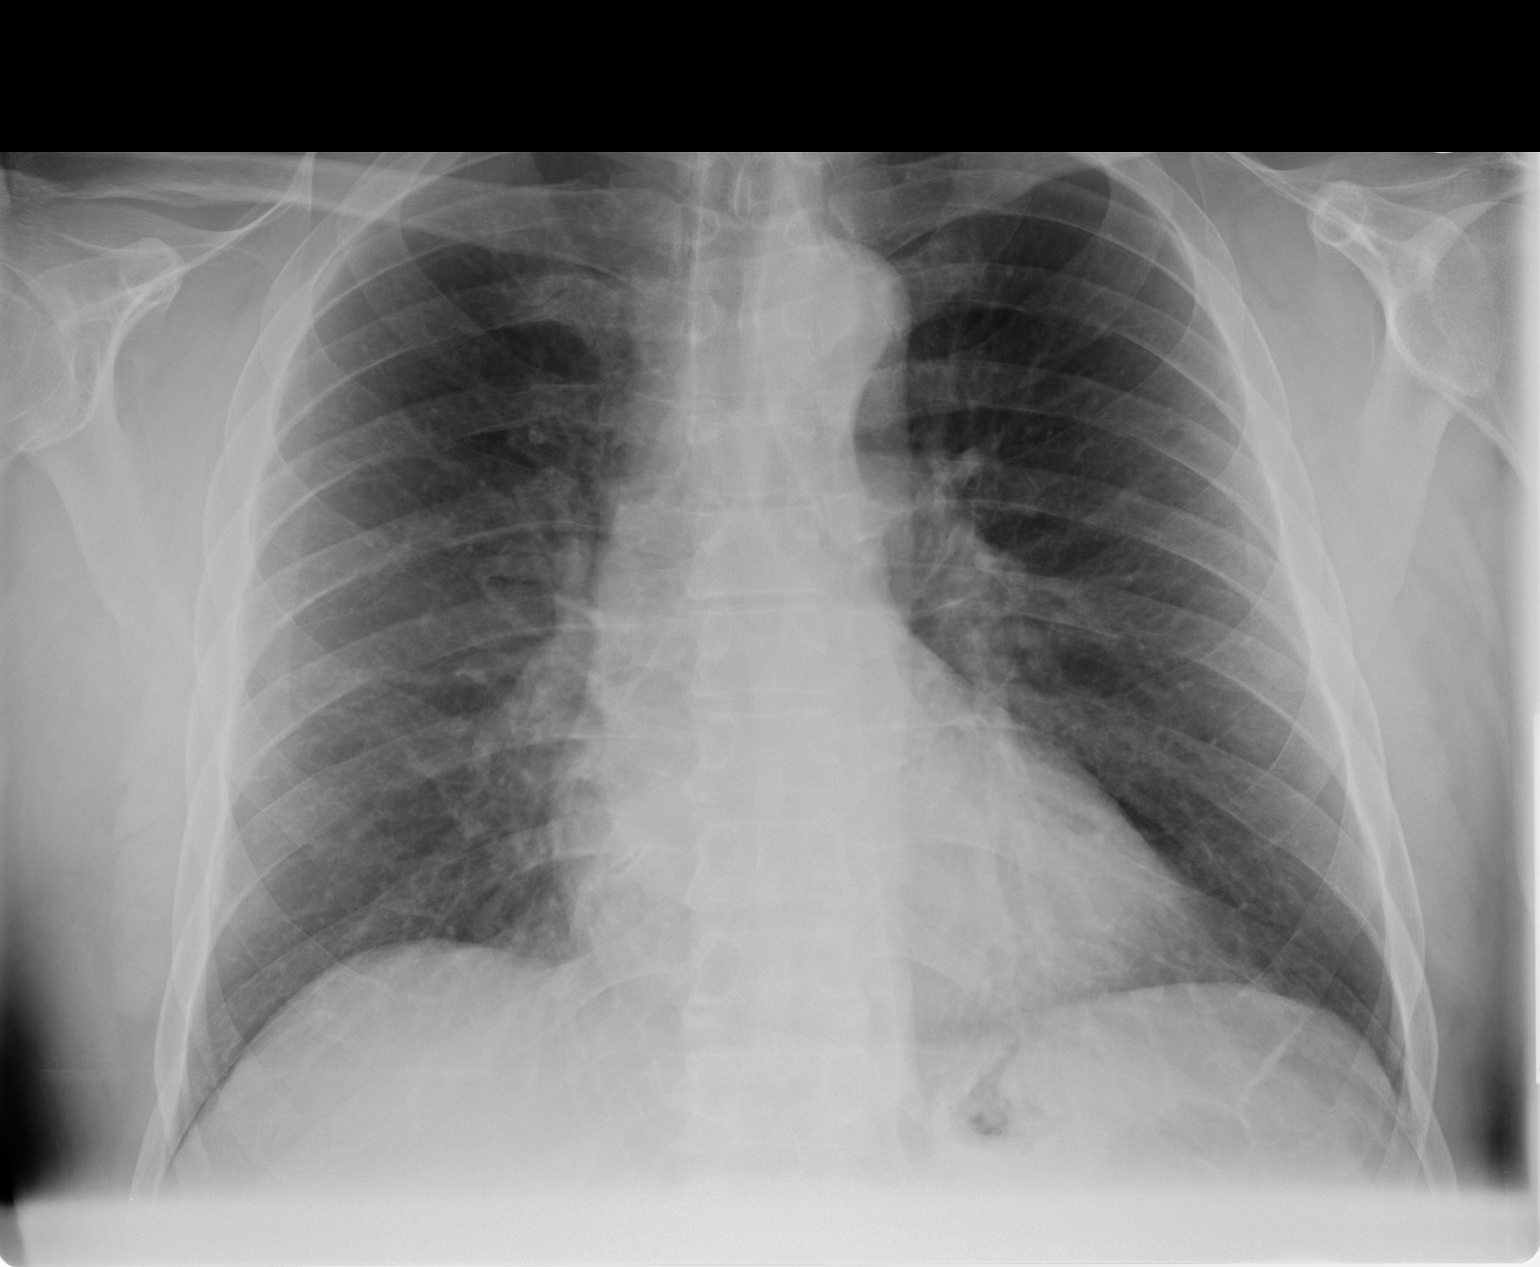

[view not recorded (2 of 2)]
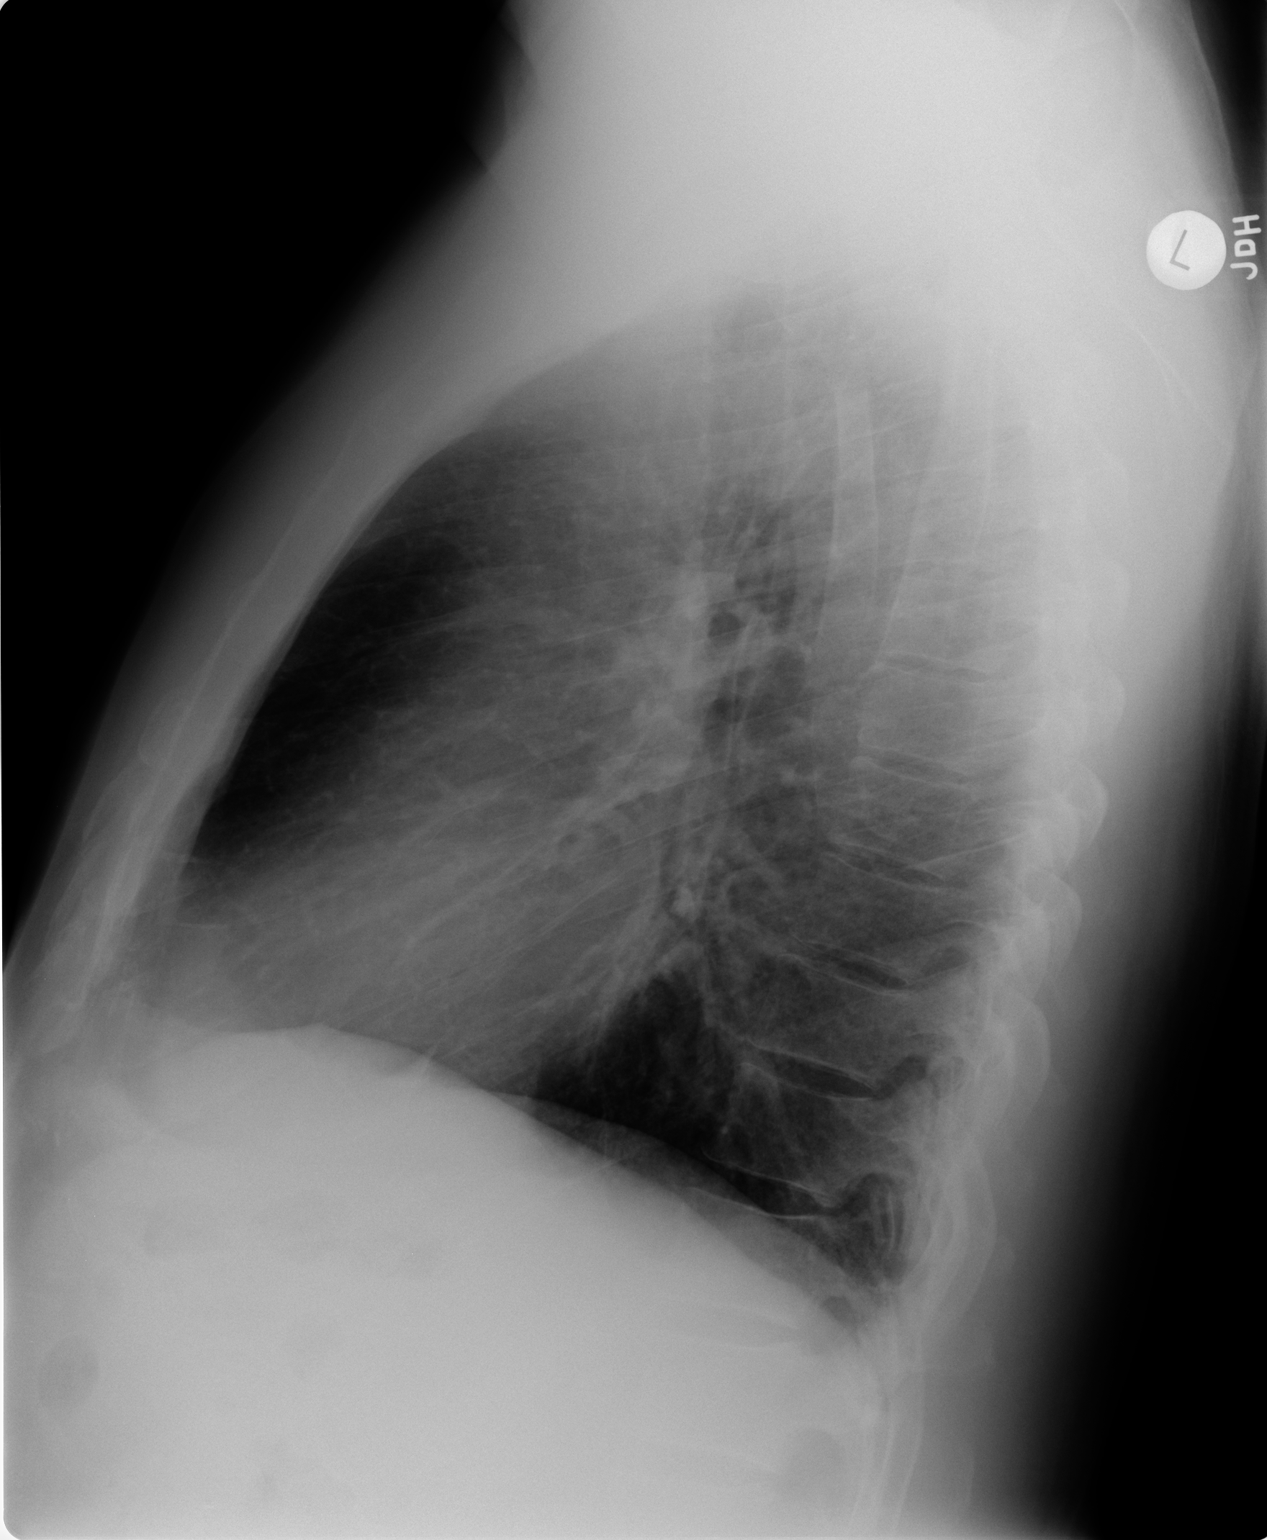

[2 of 2 positions shown; findings below may reference images not displayed]

FINDINGS: Trachea is midline. Heart size normal. Mild biapical pleural
thickening. Lungs are otherwise clear. No pleural fluid.
IMPRESSION: No acute findings.

## 2014-11-23 ENCOUNTER — Encounter: Payer: Self-pay | Admitting: Cardiology

## 2014-11-25 ENCOUNTER — Telehealth: Payer: Self-pay | Admitting: *Deleted

## 2014-11-25 DIAGNOSIS — G4733 Obstructive sleep apnea (adult) (pediatric): Secondary | ICD-10-CM

## 2014-11-25 NOTE — Telephone Encounter (Signed)
-----   Message from Quintella Reichertraci R Turner, MD sent at 11/23/2014  9:22 PM EDT ----- AHI mildy elevated - increase CPAP to 14cm H2O and recheck D/L in 4weeks

## 2014-11-25 NOTE — Telephone Encounter (Signed)
Spoke to patient to give results. Stated verbal understanding.    Orders are in, [facility] notified that [facility] will call when he gets back into town.

## 2015-01-03 ENCOUNTER — Encounter: Payer: Self-pay | Admitting: Cardiology

## 2015-01-03 ENCOUNTER — Ambulatory Visit (INDEPENDENT_AMBULATORY_CARE_PROVIDER_SITE_OTHER): Payer: BLUE CROSS/BLUE SHIELD | Admitting: Cardiology

## 2015-01-03 VITALS — BP 110/78 | HR 71 | Ht 72.0 in | Wt 240.0 lb

## 2015-01-03 DIAGNOSIS — E669 Obesity, unspecified: Secondary | ICD-10-CM

## 2015-01-03 DIAGNOSIS — R079 Chest pain, unspecified: Secondary | ICD-10-CM

## 2015-01-03 DIAGNOSIS — G4730 Sleep apnea, unspecified: Secondary | ICD-10-CM | POA: Diagnosis not present

## 2015-01-03 NOTE — Patient Instructions (Signed)
Medication Instructions:  Your physician recommends that you continue on your current medications as directed. Please refer to the Current Medication list given to you today.   Labwork: None  Testing/Procedures: None  Follow-Up: Your physician recommends that you schedule a follow-up appointment in: 3 months with Dr. Mayford Knife.  Any Other Special Instructions Will Be Listed Below (If Applicable).

## 2015-01-03 NOTE — Progress Notes (Signed)
Cardiology Office Note   Date:  01/03/2015   ID:  Joshua, Miles 09/30/1950, MRN [id number]  PCP:  Darrow Bussing, MD    Chief Complaint  Patient presents with  . chest pain, OSA      History of Present Illness: Joshua Miles is a 63 y.o. male with a history of GERD who presents today for followup of chest pain. He underwent nuclear stress test that showed no ischemia with low normal EF at 49%. He also has OSA but has not tolerated CPAP in the past but was willing to try again. He was set up for a sleep study which showed moderate OSA with an AHI of 21/hr, mild to moderate snoring and oxygen desaturations as low as 84%.  He underwent CPAP titration to 13cm H2O.  He is tolerating the CPAP device but has had some problems with the mask.  He is now using the full face mask because he is a mouth breather. He did not tolerate the nasal pillow or nasal mask.  He feels the pressure is adequate. He falls asleep on his side but ends up on his back. He has very little mouth dryness and uses the humidifier. He still does not feel rested in the am. Since I saw him last he still has a very sporadic sharp chest discomfort right below his left clavicle that only lasts a second and then resolves. He denies any SOB, DOE, LE edema or palpitations.     Past Medical History  Diagnosis Date  . Crohn's disease   . ED (erectile dysfunction)     secondary to fracture penis-Repair by Dr Logan Bores  . Rosacea   . Esophageal reflux     well controlled on prilosec  . Insomnia   . Sleep apnea     moderate with AHI 21/hr now on CPAP at 13cm H2O.   . Eczema   . Male pattern alopecia   . Rectal bleeding 08','11    Hemorrhoids??  . Obesity (BMI 30-39.9) 07/26/2014    Past Surgical History  Procedure Laterality Date  . Hernia repair    . Cosmetic surgery       Current Outpatient Prescriptions  Medication Sig Dispense Refill  . acetaminophen (TYLENOL) 500 MG tablet Take 500 mg by  mouth every 6 (six) hours as needed for pain.    . clotrimazole-betamethasone (LOTRISONE) cream Apply 1 application topically as needed.    . eszopiclone (LUNESTA) 2 MG TABS Take 2 mg by mouth daily as needed (sleep). Take immediately before bedtime    . Febuxostat (ULORIC) 80 MG TABS Take 80 mg by mouth daily.    . finasteride (PROSCAR) 5 MG tablet Take 5 mg by mouth daily. Pt takes 1/4 tablet daily    . loperamide (IMODIUM) 2 MG capsule Take 2 mg by mouth once.     Marland Kitchen omeprazole (PRILOSEC) 20 MG capsule Take 20 mg by mouth daily.    . sildenafil (VIAGRA) 100 MG tablet Take 100 mg by mouth daily as needed for erectile dysfunction.    . tacrolimus (PROTOPIC) 0.1 % ointment Apply topically 2 (two) times daily.     No current facility-administered medications for this visit.    Allergies:   Review of patient's allergies indicates no known allergies.    Social History:  The patient  reports that he has never smoked. He does not have any smokeless tobacco  history on file. He reports that he drinks alcohol. He reports that he does not use illicit drugs.   Family History:  The patient's family history includes CAD in his father; Heart disease in his father; Heart failure in his father.    ROS:  Please see the history of present illness.   Otherwise, review of systems are positive for none.   All other systems are reviewed and negative.    PHYSICAL EXAM: VS:  BP 110/78 mmHg  Pulse 71  Ht 6' (1.829 m)  Wt 240 lb (108.863 kg)  BMI 32.54 kg/m2  SpO2 98% , BMI Body mass index is 32.54 kg/(m^2). GEN: Well nourished, well developed, in no acute distress HEENT: normal Neck: no JVD, carotid bruits, or masses Cardiac: RRR; no murmurs, rubs, or gallops,no edema  Respiratory:  clear to auscultation bilaterally, normal work of breathing GI: soft, nontender, nondistended, + BS MS: no deformity or atrophy Skin: warm and dry, no rash Neuro:  Strength and sensation are intact Psych: euthymic mood,  full affect   EKG:  EKG is not ordered today.    Recent Labs: No results found for requested labs within last 365 days.    Lipid Panel No results found for: CHOL, TRIG, HDL, CHOLHDL, VLDL, LDLCALC, LDLDIRECT    Wt Readings from Last 3 Encounters:  01/03/15 240 lb (108.863 kg)  07/27/14 236 lb 12.8 oz (107.412 kg)  04/13/14 235 lb (106.595 kg)    ASSESSMENT AND PLAN: 1. Chest pain with atypical components. EKG is nonischemic. His CRF include age > 13, male sex and family history of CAD. Stress myoview showed no ischemia and low normal LVF.  His CP is very sharp and only lasts a few seconds and is most c/w musculoskeletal etiology. 2. OSA on CPAP and tolerating fairly well but having some difficulty getting adjusted to the mask.  He continues to wake up frequently but has been drinking alcohol nightly.  We discussed trying to cut back on ETOH and try to avoid it for 4 hours prior to go to sleep. I have also instructed the patient on proper sleep hygiene, avoidance of sleeping in the supine position and avoidance of alcohol within 4 hours of bedtime.  The patient was also instructed to avoid driving if sleepy.  He is going to continue to work with his mask and will let me know if he continues to have problems with sleep.  His d/l today showed an AHI of 5.8/hr on 14cm H2o and 70% compliance in using more than 4 hours nightly.   3.  Obesity - he is walking 5 miles daily   Current medicines are reviewed at length with the patient today.  The patient does not have concerns regarding medicines.  The following changes have been made:  no change  Labs/ tests ordered today: See above Assessment and Plan No orders of the defined types were placed in this encounter.     Disposition:   FU with me in 3 months  Signed, Quintella Reichert, MD  01/03/2015 3:13 PM    [facility] [address] [place], [place], [place]  [address] Phone: [phone]; Fax: [phone]

## 2015-01-10 ENCOUNTER — Encounter: Payer: Self-pay | Admitting: Cardiology

## 2015-01-18 ENCOUNTER — Encounter: Payer: Self-pay | Admitting: Cardiology

## 2015-03-29 ENCOUNTER — Encounter: Payer: Self-pay | Admitting: Cardiology

## 2015-03-29 ENCOUNTER — Ambulatory Visit (INDEPENDENT_AMBULATORY_CARE_PROVIDER_SITE_OTHER): Payer: BLUE CROSS/BLUE SHIELD | Admitting: Cardiology

## 2015-03-29 VITALS — BP 108/78 | HR 72 | Ht 71.5 in | Wt 239.0 lb

## 2015-03-29 DIAGNOSIS — G4730 Sleep apnea, unspecified: Secondary | ICD-10-CM

## 2015-03-29 DIAGNOSIS — E669 Obesity, unspecified: Secondary | ICD-10-CM | POA: Diagnosis not present

## 2015-03-29 DIAGNOSIS — R079 Chest pain, unspecified: Secondary | ICD-10-CM | POA: Diagnosis not present

## 2015-03-29 NOTE — Patient Instructions (Signed)

## 2015-03-29 NOTE — Progress Notes (Signed)
Cardiology Office Note   Date:  03/29/2015   ID:  Joshua Miles, DOB [birth year], MRN [id number]  PCP:  Darrow Bussing, MD    Chief Complaint  Patient presents with  . Sleep Apnea  . Obesity      History of Present Illness: Joshua Miles is a 64 y.o. male with a history of GERD who presents today for followup of chest pain. He underwent nuclear stress test that showed no ischemia with low normal EF at 49%. He also has OSA but has not tolerated CPAP in the past but was willing to try again. He was set up for a sleep study which showed moderate OSA with an AHI of 21/hr, mild to moderate snoring and oxygen desaturations as low as 84%. He underwent CPAP titration to 13cm H2O. He is tolerating the CPAP device. He is now using the full face mask because he is a mouth breather. He did not tolerate the nasal pillow or nasal mask. He feels the pressure is adequate. He falls asleep on his side but ends up on his back.He says that he does not get enough sleeps so still does not feel rested in the am.  He only gets 6 hours of sleep nightly. He does sleep more soundly.   He has very little mouth dryness and uses the humidifier.  He denies any chest pain, SOB, DOE, LE edema or palpitations.    Past Medical History  Diagnosis Date  . Crohn's disease (HCC)   . ED (erectile dysfunction)     secondary to fracture penis-Repair by Dr Logan Bores  . Rosacea   . Esophageal reflux     well controlled on prilosec  . Insomnia   . Sleep apnea     moderate with AHI 21/hr now on CPAP at 13cm H2O.   . Eczema   . Male pattern alopecia   . Rectal bleeding 08','11    Hemorrhoids??  . Obesity (BMI 30-39.9) 07/26/2014    Past Surgical History  Procedure Laterality Date  . Hernia repair    . Cosmetic surgery       Current Outpatient Prescriptions  Medication Sig Dispense Refill  . acetaminophen (TYLENOL) 500 MG tablet Take 500 mg by mouth every 6 (six) hours as needed for pain.    .  clotrimazole-betamethasone (LOTRISONE) cream Apply 1 application topically as needed.    . eszopiclone (LUNESTA) 2 MG TABS Take 2 mg by mouth daily as needed (sleep). Take immediately before bedtime    . Febuxostat (ULORIC) 80 MG TABS Take 80 mg by mouth daily.    . finasteride (PROSCAR) 5 MG tablet Take 5 mg by mouth daily. Pt takes 1/4 tablet daily    . loperamide (IMODIUM) 2 MG capsule Take two tablets by mouth daily    . omeprazole (PRILOSEC) 20 MG capsule Take 20 mg by mouth daily.    . sildenafil (VIAGRA) 100 MG tablet Take 100 mg by mouth daily as needed for erectile dysfunction.    . tacrolimus (PROTOPIC) 0.1 % ointment Apply topically 2 (two) times daily.     No current facility-administered medications for this visit.    Allergies:   Review of patient's allergies indicates no known allergies.    Social History:  The patient  reports that he has never smoked. He does not have any smokeless tobacco history on file. He reports that he drinks alcohol. He reports  that he does not use illicit drugs.   Family History:  The patient's family history includes CAD in his father; Heart disease in his father; Heart failure in his father.    ROS:  Please see the history of present illness.   Otherwise, review of systems are positive for none.   All other systems are reviewed and negative.    PHYSICAL EXAM: VS:  BP 108/78 mmHg  Pulse 72  Ht 5' 11.5" (1.816 m)  Wt 108.41 kg (239 lb)  BMI 32.87 kg/m2 , BMI Body mass index is 32.87 kg/(m^2). GEN: Well nourished, well developed, in no acute distress HEENT: normal Neck: no JVD, carotid bruits, or masses Cardiac: RRR; no murmurs, rubs, or gallops,no edema  Respiratory:  clear to auscultation bilaterally, normal work of breathing GI: soft, nontender, nondistended, + BS MS: no deformity or atrophy Skin: warm and dry, no rash Neuro:  Strength and sensation are intact Psych: euthymic mood, full affect   EKG:  EKG is not ordered  today.    Recent Labs: No results found for requested labs within last 365 days.    Lipid Panel No results found for: CHOL, TRIG, HDL, CHOLHDL, VLDL, LDLCALC, LDLDIRECT    Wt Readings from Last 3 Encounters:  03/29/15 108.41 kg (239 lb)  01/03/15 108.863 kg (240 lb)  07/27/14 107.412 kg (236 lb 12.8 oz)     ASSESSMENT AND PLAN: 1. Chest pain with atypical components. EKG is nonischemic. His CRF include age > 6640, male sex and family history of CAD. Stress myoview showed no ischemia and low normal LVF. His CP has completely resolved 2. OSA on CPAP and tolerating his CPAP well. His d/l today showed an AHI of 2.1/hr on 14cm H2o and 100% compliance in using more than 4 hours nightly.  3. Obesity - he had been walking 5 miles daily but that has slowed down due to cold weather.   Current medicines are reviewed at length with the patient today.  The patient does not have concerns regarding medicines.  The following changes have been made:  no change  Labs/ tests ordered today: See above Assessment and Plan No orders of the defined types were placed in this encounter.     Disposition:   FU with me in 1 year  Signed, Quintella ReichertURNER,TRACI R, MD  03/29/2015 2:43 PM    [facility] [address], [place], [place]  [address] Phone: [phone]; Fax: [phone]

## 2015-04-12 ENCOUNTER — Encounter: Payer: Self-pay | Admitting: Cardiology
